# Patient Record
Sex: Female | Born: 2000 | Race: White | Hispanic: Yes | Marital: Single | Smoking: Never smoker
Health system: Southern US, Community
[De-identification: ages and names within clinical notes are randomized; demographics above are authoritative.]

## PROBLEM LIST (undated history)

## (undated) DIAGNOSIS — R630 Anorexia: Secondary | ICD-10-CM

---
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
## 2022-01-04 DIAGNOSIS — Z419 Encounter for procedure for purposes other than remedying health state, unspecified: Secondary | ICD-10-CM | POA: Diagnosis not present

## 2022-01-28 ENCOUNTER — Emergency Department (HOSPITAL_COMMUNITY)
Admission: EM | Admit: 2022-01-28 | Discharge: 2022-01-28 | Disposition: A | Discharge status: Home / Self Care | Payer: Medicaid Other | Attending: Emergency Medicine | Admitting: Emergency Medicine

## 2022-01-28 ENCOUNTER — Emergency Department (HOSPITAL_COMMUNITY): Payer: Medicaid Other

## 2022-01-28 ENCOUNTER — Other Ambulatory Visit: Payer: Self-pay

## 2022-01-28 ENCOUNTER — Encounter (HOSPITAL_COMMUNITY): Payer: Self-pay

## 2022-01-28 DIAGNOSIS — M25511 Pain in right shoulder: Secondary | ICD-10-CM | POA: Diagnosis not present

## 2022-01-28 DIAGNOSIS — G8929 Other chronic pain: Secondary | ICD-10-CM | POA: Diagnosis not present

## 2022-01-28 MED ORDER — KETOROLAC TROMETHAMINE 15 MG/ML IJ SOLN
15.0000 mg | Freq: Once | INTRAMUSCULAR | Status: AC
Start: 2022-01-28 — End: 2022-01-28
  Administered 2022-01-28: 15 mg via INTRAMUSCULAR
  Filled 2022-01-28: qty 1

## 2022-01-28 MED ORDER — NAPROXEN 375 MG PO TABS: 375.0000 mg | ORAL_TABLET | Freq: Two times a day (BID) | ORAL | 0 refills | Status: DC

## 2022-01-28 NOTE — ED Provider Notes (Signed)
[facility] EMERGENCY DEPARTMENT Provider Note   CSN: [id number] Arrival date & time: 01/28/22  1545     History Chief Complaint  Patient presents with   Shoulder Pain    Kristina Morrow is a 21 y.o. female who presents to the emergency department with right shoulder pain that has been chronic for some time.  Patient states that she was seen here late last year for similar symptoms.  X-ray was negative at that time.  Patient states now that her right shoulder is been bothering her more frequently and almost on a daily basis.  Nothing seems to make this better or worse.  She has not taken anything for this.  She never followed up with orthopedics the first time.   Shoulder Pain     Home Medications Prior to Admission medications   Medication Sig Start Date End Date Taking? Authorizing Provider  naproxen (NAPROSYN) 375 MG tablet Take 1 tablet (375 mg total) by mouth 2 (two) times daily. 01/28/22  Yes Meredeth Ide, Johan Creveling M, PA-C  clindamycin (CLEOCIN) 150 MG capsule Take 1 capsule (150 mg total) by mouth every 6 (six) hours. 06/02/18   Mabe, Latanya Maudlin, MD      Allergies    Peanut-containing drug products    Review of Systems   Review of Systems  All other systems reviewed and are negative.  Physical Exam Updated Vital Signs BP 123/84   Pulse 98   Temp 97.7 F (36.5 C) (Oral)   Resp 16   Ht 5\' 2"  (1.575 m)   Wt 53.5 kg   SpO2 100%   BMI 21.58 kg/m  Physical Exam Vitals and nursing note reviewed.  Constitutional:      Appearance: Normal appearance.  HENT:     Head: Normocephalic and atraumatic.  Eyes:     General:        Right eye: No discharge.        Left eye: No discharge.     Conjunctiva/sclera: Conjunctivae normal.  Pulmonary:     Effort: Pulmonary effort is normal.  Musculoskeletal:     Comments: She has full range of motion in the shoulder with passive and active range of motion.  Negative empty can, Hawkins, and Neer test.  No tenderness over the  bicipital groove.  Skin:    General: Skin is warm and dry.     Findings: No rash.  Neurological:     General: No focal deficit present.     Mental Status: She is alert.  Psychiatric:        Mood and Affect: Mood normal.        Behavior: Behavior normal.    ED Results / Procedures / Treatments   Labs (all labs ordered are listed, but only abnormal results are displayed) Labs Reviewed - No data to display  EKG None  Radiology DG Shoulder Right  Result Date: 01/28/2022 CLINICAL DATA:  Right shoulder pain EXAM: RIGHT SHOULDER - 2+ VIEW COMPARISON:  None Available. FINDINGS: There is no evidence of fracture or dislocation. There is no evidence of arthropathy or other focal bone abnormality. Soft tissues are unremarkable. IMPRESSION: Negative. Electronically Signed   By: 01/30/2022 M.D.   On: 01/28/2022 17:57    Procedures Procedures    Medications Ordered in ED Medications  ketorolac (TORADOL) 15 MG/ML injection 15 mg (15 mg Intramuscular Given 01/28/22 1930)    ED Course/ Medical Decision Making/ A&P  Medical Decision Making Kristina Morrow is a 21 y.o. female who presents to the emergency department today with right-sided shoulder pain.  This is been going on for a while and I have a low suspicion for any emergent pathology at this time.  Could be impingement syndrome, rotator cuff injury although the patient has negative impingement signs.  I personally ordered and interpreted x-ray of the right shoulder which was negative for any fractures or dislocations.  I will have her follow-up with orthopedics for further evaluation.   Amount and/or Complexity of Data Reviewed Radiology: ordered.  Risk Prescription drug management.   Final Clinical Impression(s) / ED Diagnoses Final diagnoses:  Chronic right shoulder pain    Rx / DC Orders ED Discharge Orders          Ordered    naproxen (NAPROSYN) 375 MG tablet  2 times daily        01/28/22 1932               Jolyn Lent 01/28/22 Virl Son, MD 01/28/22 [id number]

## 2022-01-28 NOTE — ED Provider Triage Note (Signed)
Emergency Medicine Provider Triage Evaluation Note  Kristina Morrow , a 21 y.o. female  was evaluated in triage.  Pt complains of right shoulder pain that is chronic and has been ongoing for some time. It has been worse over the last week. She denies injury.  Review of Systems  Positive:  Negative: See above   Physical Exam  BP 123/84   Pulse 98   Temp 97.7 F (36.5 C) (Oral)   Resp 16   SpO2 100%  Gen:   Awake, no distress   Resp:  Normal effort  MSK:   Moves extremities without difficulty.  Full range of motion in the right shoulder.  Neurovascular intact distal to the shoulder. Other:    Medical Decision Making  Medically screening exam initiated at 3:59 PM.  Appropriate orders placed.  Lanell Matar was informed that the remainder of the evaluation will be completed by another provider, this initial triage assessment does not replace that evaluation, and the importance of remaining in the ED until their evaluation is complete.     Honor [place], [place] 01/28/22 [phone]

## 2022-01-28 NOTE — ED Triage Notes (Signed)
Pt arrived POV from home c/o right shoulder pain. Pt denies any injury but states she does play softball.

## 2022-01-28 NOTE — Discharge Instructions (Addendum)
Please take naproxen as prescribed.  I would like for you to follow-up with orthopedics or your PCP for further evaluation.  Please return to the emergency department for any worsening symptoms you might have.

## 2022-01-28 NOTE — ED Notes (Signed)
Patient verbalizes understanding of discharge instructions. Opportunity for questioning and answers were provided. Armband removed by staff, pt discharged from ED to home via POV. Waited in ER 15 min after receiving IM injection, denied any adverse effects prior to departure.

## 2022-02-04 DIAGNOSIS — Z419 Encounter for procedure for purposes other than remedying health state, unspecified: Secondary | ICD-10-CM | POA: Diagnosis not present

## 2022-02-04 DIAGNOSIS — M25511 Pain in right shoulder: Secondary | ICD-10-CM | POA: Diagnosis not present

## 2022-03-04 ENCOUNTER — Encounter (HOSPITAL_COMMUNITY): Payer: Self-pay

## 2022-03-04 ENCOUNTER — Emergency Department (HOSPITAL_COMMUNITY)
Admission: EM | Admit: 2022-03-04 | Discharge: 2022-03-04 | Disposition: A | Discharge status: Home / Self Care | Payer: Medicaid Other | Attending: Emergency Medicine | Admitting: Emergency Medicine

## 2022-03-04 ENCOUNTER — Other Ambulatory Visit: Payer: Self-pay

## 2022-03-04 DIAGNOSIS — R110 Nausea: Secondary | ICD-10-CM | POA: Insufficient documentation

## 2022-03-04 DIAGNOSIS — I10 Essential (primary) hypertension: Secondary | ICD-10-CM | POA: Diagnosis not present

## 2022-03-04 DIAGNOSIS — F509 Eating disorder, unspecified: Secondary | ICD-10-CM | POA: Insufficient documentation

## 2022-03-04 DIAGNOSIS — R1084 Generalized abdominal pain: Secondary | ICD-10-CM | POA: Diagnosis not present

## 2022-03-04 DIAGNOSIS — R42 Dizziness and giddiness: Secondary | ICD-10-CM | POA: Insufficient documentation

## 2022-03-04 DIAGNOSIS — R109 Unspecified abdominal pain: Secondary | ICD-10-CM | POA: Insufficient documentation

## 2022-03-04 DIAGNOSIS — E860 Dehydration: Secondary | ICD-10-CM | POA: Diagnosis not present

## 2022-03-04 DIAGNOSIS — Z91010 Allergy to peanuts: Secondary | ICD-10-CM | POA: Insufficient documentation

## 2022-03-04 DIAGNOSIS — Z743 Need for continuous supervision: Secondary | ICD-10-CM | POA: Diagnosis not present

## 2022-03-04 HISTORY — DX: Anorexia: R63.0

## 2022-03-04 LAB — COMPREHENSIVE METABOLIC PANEL
ALT: 22 U/L (ref 0–44)
AST: 40 U/L (ref 15–41)
Albumin: 4.7 g/dL (ref 3.5–5.0)
Alkaline Phosphatase: 49 U/L (ref 38–126)
Anion gap: 12 (ref 5–15)
BUN: 10 mg/dL (ref 6–20)
CO2: 23 mmol/L (ref 22–32)
Calcium: 9.2 mg/dL (ref 8.9–10.3)
Chloride: 101 mmol/L (ref 98–111)
Creatinine, Ser: 0.75 mg/dL (ref 0.44–1.00)
GFR, Estimated: >60 mL/min (ref >60)
Glucose, Bld: 91 mg/dL (ref 70–99)
Potassium: 3.7 mmol/L (ref 3.5–5.1)
Sodium: 136 mmol/L (ref 135–145)
Total Bilirubin: 2 mg/dL — ABNORMAL HIGH (ref 0.3–1.2)
Total Protein: 8.2 g/dL — ABNORMAL HIGH (ref 6.5–8.1)

## 2022-03-04 LAB — CBC
HCT: 44.5 % (ref 36.0–46.0)
Hemoglobin: 15 g/dL (ref 12.0–15.0)
MCH: 29.8 pg (ref 26.0–34.0)
MCHC: 33.7 g/dL (ref 30.0–36.0)
MCV: 88.5 fL (ref 80.0–100.0)
Platelets: 276 10*3/uL (ref 150–400)
RBC: 5.03 MIL/uL (ref 3.87–5.11)
RDW: 12.9 % (ref 11.5–15.5)
WBC: 11.2 10*3/uL — ABNORMAL HIGH (ref 4.0–10.5)
nRBC: 0 % (ref 0.0–0.2)

## 2022-03-04 LAB — URINALYSIS, ROUTINE W REFLEX MICROSCOPIC
Bacteria, UA: NONE SEEN
Bilirubin Urine: NEGATIVE
Glucose, UA: NEGATIVE mg/dL
Ketones, ur: 80 mg/dL — AB
Leukocytes,Ua: NEGATIVE
Nitrite: NEGATIVE
Protein, ur: NEGATIVE mg/dL
Specific Gravity, Urine: 1.014 (ref 1.005–1.030)
pH: 6 (ref 5.0–8.0)

## 2022-03-04 LAB — I-STAT BETA HCG BLOOD, ED (MC, WL, AP ONLY): I-stat hCG, quantitative: <5 m[IU]/mL (ref <5)

## 2022-03-04 LAB — LIPASE, BLOOD: Lipase: 23 U/L (ref 11–51)

## 2022-03-04 MED ORDER — LACTATED RINGERS IV BOLUS
1000.0000 mL | Freq: Once | INTRAVENOUS | Status: AC
  Administered 2022-03-04: 1000 mL via INTRAVENOUS

## 2022-03-04 MED ORDER — LACTATED RINGERS IV SOLN: INTRAVENOUS | Status: DC

## 2022-03-04 NOTE — ED Provider Notes (Addendum)
[facility]-EMERGENCY DEPT Provider Note   CSN: [id number] Arrival date & time: 03/04/22  1050     History  Chief Complaint  Patient presents with   Abdominal Pain   Eating Disorder   Nausea   Dizziness    Kristina Morrow is a 21 y.o. female.  21 year old female with history of anorexia presents due to decreased oral intake.  Did have emesis a few days ago but has since resolved.  Had some abdominal cramping next 2 days ago which is also resolved.  Denies any fever.  Is concerned this may be dehydrated.  Denies any suicidal or homicidal ideations.  States that she feels as if she needs help with her anorexia at this time.  Has never been in treatment before in the past.  Denies any syncope or near syncope.  But does note some dizziness       Home Medications Prior to Admission medications   Medication Sig Start Date End Date Taking? Authorizing Provider  clindamycin (CLEOCIN) 150 MG capsule Take 1 capsule (150 mg total) by mouth every 6 (six) hours. 06/02/18   Mabe, Latanya Maudlin, MD  naproxen (NAPROSYN) 375 MG tablet Take 1 tablet (375 mg total) by mouth 2 (two) times daily. 01/28/22   Teressa Lower, PA-C      Allergies    Peanut-containing drug products    Review of Systems   Review of Systems  All other systems reviewed and are negative.   Physical Exam Updated Vital Signs BP 113/82 (BP Location: Left Arm)   Pulse 80   Temp 98 F (36.7 C) (Oral)   Resp 18   Ht 1.575 m (5\' 2" )   Wt 54.4 kg   LMP 02/11/2022   SpO2 100%   BMI 21.95 kg/m  Physical Exam Vitals and nursing note reviewed.  Constitutional:      General: She is not in acute distress.    Appearance: Normal appearance. She is well-developed. She is not toxic-appearing.  HENT:     Head: Normocephalic and atraumatic.  Eyes:     General: Lids are normal.     Conjunctiva/sclera: Conjunctivae normal.     Pupils: Pupils are equal, round, and reactive to light.  Neck:     Thyroid: No  thyroid mass.     Trachea: No tracheal deviation.  Cardiovascular:     Rate and Rhythm: Normal rate and regular rhythm.     Heart sounds: Normal heart sounds. No murmur heard.    No gallop.  Pulmonary:     Effort: Pulmonary effort is normal. No respiratory distress.     Breath sounds: Normal breath sounds. No stridor. No decreased breath sounds, wheezing, rhonchi or rales.  Abdominal:     General: There is no distension.     Palpations: Abdomen is soft.     Tenderness: There is no abdominal tenderness. There is no rebound.  Musculoskeletal:        General: No tenderness. Normal range of motion.     Cervical back: Normal range of motion and neck supple.  Skin:    General: Skin is warm and dry.     Findings: No abrasion or rash.  Neurological:     Mental Status: She is alert and oriented to person, place, and time. Mental status is at baseline.     GCS: GCS eye subscore is 4. GCS verbal subscore is 5. GCS motor subscore is 6.     Cranial Nerves: No cranial nerve deficit.  Sensory: No sensory deficit.     Motor: Motor function is intact.  Psychiatric:        Attention and Perception: Attention normal.        Speech: Speech normal.        Behavior: Behavior normal. Behavior is cooperative.     ED Results / Procedures / Treatments   Labs (all labs ordered are listed, but only abnormal results are displayed) Labs Reviewed  COMPREHENSIVE METABOLIC PANEL - Abnormal; Notable for the following components:      Result Value   Total Protein 8.2 (*)    Total Bilirubin 2.0 (*)    All other components within normal limits  CBC - Abnormal; Notable for the following components:   WBC 11.2 (*)    All other components within normal limits  LIPASE, BLOOD  URINALYSIS, ROUTINE W REFLEX MICROSCOPIC  I-STAT BETA HCG BLOOD, ED ([facility], [facility], [facility] ONLY)    EKG EKG Interpretation  Date/Time:  Thursday March 04 2022 12:26:47 EDT Ventricular Rate:  86 PR Interval:  94 QRS Duration: 64 QT  Interval:  356 QTC Calculation: 426 R Axis:   85 Text Interpretation: Sinus rhythm Short PR interval Abnormal Q suggests anterior infarct Confirmed by Lorre Nick ([id number]) on 03/04/2022 1:02:23 PM  Radiology No results found.  Procedures Procedures    Medications Ordered in ED Medications  lactated ringers bolus 1,000 mL (has no administration in time range)  lactated ringers infusion (has no administration in time range)    ED Course/ Medical Decision Making/ A&P                           Medical Decision Making Amount and/or Complexity of Data Reviewed Labs: ordered.  Risk Prescription drug management.   Patient given IV fluids here.  Electrolytes are stable.  Patient did have greater than 80 ketones in her urine indicating dehydration.  She has no abdominal pain on exam.  Do not feel that she requires imaging of her abdomen.  Patient has no acute psychiatric emergency.  Plan will be for patient to be discharged and follow-up with her primary care doctor        Final Clinical Impression(s) / ED Diagnoses Final diagnoses:  None    Rx / DC Orders ED Discharge Orders     None         Lorre Nick, MD 03/04/22 1422    Lorre Nick, MD 03/04/22 1436

## 2022-03-04 NOTE — ED Notes (Signed)
Pt prompted to provide urine sample, but unable to at this time.  

## 2022-03-04 NOTE — ED Provider Triage Note (Signed)
Emergency Medicine Provider Triage Evaluation Note  Kristina Morrow , a 21 y.o. female  was evaluated in triage.  Pt complains of lightheadedness, decreased p.o. intake, left-sided abdominal pain.  Denies dysuria.  Review of Systems  Positive: As above Negative: As above  Physical Exam  BP 113/82 (BP Location: Left Arm)   Pulse 80   Temp 98 F (36.7 C) (Oral)   Resp 18   Ht 5\' 2"  (1.575 m)   Wt 54.4 kg   LMP 02/11/2022   SpO2 100%   BMI 21.95 kg/m  Gen:   Awake, no distress   Resp:  Normal effort  MSK:   Moves extremities without difficulty  Other:  Abdomen nontender and nondistended  Medical Decision Making  Medically screening exam initiated at 12:07 PM.  Appropriate orders placed.  04/13/2022 was informed that the remainder of the evaluation will be completed by another provider, this initial triage assessment does not replace that evaluation, and the importance of remaining in the ED until their evaluation is complete.     Lanell Matar, PA-C 03/04/22 1208

## 2022-03-04 NOTE — ED Provider Triage Note (Signed)
Emergency Medicine Provider Triage Evaluation Note  Kristina Morrow , a 21 y.o. female  was evaluated in triage.  Pt complains of abdominal pain and decreased p.o. intake.  Patient reports that she is anorexic and the only thing she has had to eat or drink in the last 48 hours is 1 banana.  Patient reports that she started having some left upper abdominal pain, nausea, and feeling lightheaded.  Abdominal pain and nausea have resolved.  Denies any illicit drug use.  Endorses occasional alcohol use.  Review of Systems  Positive: Abdominal pain, nausea, lightheadedness, decreased p.o. intake Negative: Fever, chills, vomiting, blood in stool, melena, dysuria, hematuria, urinary urgency, vaginal pain, vaginal bleeding, vaginal discharge, SI, HI, AVH  Physical Exam  BP 113/82 (BP Location: Left Arm)   Pulse 80   Temp 98 F (36.7 C) (Oral)   Resp 18   Ht 5\' 2"  (1.575 m)   Wt 54.4 kg   LMP 02/11/2022   SpO2 100%   BMI 21.95 kg/m  Gen:   Awake, no distress   Resp:  Normal effort  MSK:   Moves extremities without difficulty  Other:  Abdomen soft, nondistended, nontender no guarding or rebound tenderness.  Medical Decision Making  Medically screening exam initiated at 11:49 AM.  Appropriate orders placed.  04/13/2022 was informed that the remainder of the evaluation will be completed by another provider, this initial triage assessment does not replace that evaluation, and the importance of remaining in the ED until their evaluation is complete.  Labs ordered to evaluate for abdominal pain   Lanell Matar, PA-C 03/04/22 1150

## 2022-03-04 NOTE — ED Triage Notes (Signed)
Per EMS- Patient is from home. Patient reported that she has anorexia. Patient states she has not had anything to eat or drink in several days. Patient c/o dizziness and left abdominal pain.  EMS gave LR-150 ml.

## 2022-03-06 DIAGNOSIS — Z419 Encounter for procedure for purposes other than remedying health state, unspecified: Secondary | ICD-10-CM | POA: Diagnosis not present

## 2022-03-11 DIAGNOSIS — F419 Anxiety disorder, unspecified: Secondary | ICD-10-CM | POA: Diagnosis not present

## 2022-03-11 DIAGNOSIS — F5000 Anorexia nervosa, unspecified: Secondary | ICD-10-CM | POA: Diagnosis not present

## 2022-03-19 DIAGNOSIS — F411 Generalized anxiety disorder: Secondary | ICD-10-CM | POA: Diagnosis not present

## 2022-03-22 DIAGNOSIS — F411 Generalized anxiety disorder: Secondary | ICD-10-CM | POA: Diagnosis not present

## 2022-03-29 DIAGNOSIS — F411 Generalized anxiety disorder: Secondary | ICD-10-CM | POA: Diagnosis not present

## 2022-04-03 ENCOUNTER — Encounter (HOSPITAL_COMMUNITY): Payer: Self-pay

## 2022-04-03 ENCOUNTER — Ambulatory Visit (HOSPITAL_COMMUNITY)
Admission: EM | Admit: 2022-04-03 | Discharge: 2022-04-03 | Disposition: A | Discharge status: Home / Self Care | Payer: Medicaid Other | Attending: Physician Assistant | Admitting: Physician Assistant

## 2022-04-03 DIAGNOSIS — M654 Radial styloid tenosynovitis [de Quervain]: Secondary | ICD-10-CM | POA: Diagnosis not present

## 2022-04-03 MED ORDER — IBUPROFEN 600 MG PO TABS: 600.0000 mg | ORAL_TABLET | Freq: Three times a day (TID) | ORAL | 0 refills | Status: DC | PRN

## 2022-04-03 NOTE — ED Provider Notes (Signed)
[facility]    CSN: [id number] Arrival date & time: 04/03/22  1535      History   Chief Complaint Chief Complaint  Patient presents with   Wrist Pain    HPI Kristina Morrow is a 21 y.o. female.   Patient is today with a 10-day history of left thumb/wrist pain.  Denies any known injury or increase in activity prior to symptom onset.  Pain is rated 8 on a 0-10 pain scale, localized to left first MCP joint, described as sharp, worse with palpation or certain movements.  She does report some numbness in the distal part of her fingers that occurs intermittently.  She is not currently experiencing those symptoms.  She is right-handed.  She has tried some ibuprofen with temporary relief of symptoms.  Denies episodes of similar symptoms in the past.    Past Medical History:  Diagnosis Date   Anorexia     There are no problems to display for this patient.   History reviewed. No pertinent surgical history.  OB History   No obstetric history on file.      Home Medications    Prior to Admission medications   Medication Sig Start Date End Date Taking? Authorizing Provider  ibuprofen (ADVIL) 600 MG tablet Take 1 tablet (600 mg total) by mouth every 8 (eight) hours as needed. 04/03/22  Yes Keldric Poyer, Noberto Retort, PA-C    Family History Family History  Problem Relation Age of Onset   Healthy Mother    Healthy Father     Social History Social History   Tobacco Use   Smoking status: Never   Smokeless tobacco: Never  Vaping Use   Vaping Use: Some days   Substances: Nicotine, Flavoring  Substance Use Topics   Alcohol use: Never   Drug use: Never     Allergies   Peanut-containing drug products   Review of Systems Review of Systems  Constitutional:  Positive for activity change. Negative for appetite change, fatigue and fever.  Musculoskeletal:  Positive for arthralgias and myalgias.  Skin:  Negative for color change and wound.  Neurological:  Negative for  weakness and numbness.     Physical Exam Triage Vital Signs ED Triage Vitals [04/03/22 1627]  Enc Vitals Group     BP 122/86     Pulse Rate 84     Resp 16     Temp 98.8 F (37.1 C)     Temp Source Oral     SpO2 100 %     Weight      Height      Head Circumference      Peak Flow      Pain Score 8     Pain Loc      Pain Edu?      Excl. in GC?    No data found.  Updated Vital Signs BP 122/86 (BP Location: Right Arm)   Pulse 84   Temp 98.8 F (37.1 C) (Oral)   Resp 16   LMP 03/20/2022 (Approximate)   SpO2 100%   Visual Acuity Right Eye Distance:   Left Eye Distance:   Bilateral Distance:    Right Eye Near:   Left Eye Near:    Bilateral Near:     Physical Exam Vitals reviewed.  Constitutional:      General: She is awake. She is not in acute distress.    Appearance: Normal appearance. She is well-developed. She is not ill-appearing.     Comments:  Very pleasant female appears stated age in no acute distress sitting comfortably in exam room  HENT:     Head: Normocephalic and atraumatic.  Cardiovascular:     Rate and Rhythm: Normal rate and regular rhythm.     Heart sounds: Normal heart sounds, S1 normal and S2 normal. No murmur heard.    Comments: Capillary refill within 2 seconds left fingers Pulmonary:     Effort: Pulmonary effort is normal.     Breath sounds: Normal breath sounds. No wheezing, rhonchi or rales.     Comments: Clear to auscultation bilaterally Abdominal:     Palpations: Abdomen is soft.     Tenderness: There is no abdominal tenderness.  Musculoskeletal:     Left wrist: No swelling, tenderness, bony tenderness or snuff box tenderness. Normal range of motion.     Left hand: Tenderness present. No swelling or bony tenderness. Normal range of motion. Normal strength. There is no disruption of two-point discrimination.     Comments: Left hand/wrist: No deformity.  Tenderness palpation over left first MCP joint.  Positive Finkelstein.  Hand  neurovascularly intact.  Psychiatric:        Behavior: Behavior is cooperative.      UC Treatments / Results  Labs (all labs ordered are listed, but only abnormal results are displayed) Labs Reviewed - No data to display  EKG   Radiology No results found.  Procedures Procedures (including critical care time)  Medications Ordered in UC Medications - No data to display  Initial Impression / Assessment and Plan / UC Course  I have reviewed the triage vital signs and the nursing notes.  Pertinent labs & imaging results that were available during my care of the patient were reviewed by me and considered in my medical decision making (see chart for details).     Discussed symptoms are consistent with de Quervain's tenosynovitis.  Patient was placed in a brace for comfort and support.  Recommended RICE protocol for additional symptom relief.  She was started on ibuprofen for symptom relief.  Discussed that she should not take additional NSAIDs with this medication due to risk of GI bleeding but can use Tylenol for breakthrough pain.  If her symptoms or not improving she is to follow-up with hand specialist particular if she has recurrent numbness/paresthesias.  She was given contact information for local provider with instruction to call to schedule appointment.  Discussed that if she has any worsening symptoms she needs to be seen immediately.  Strict return precautions given.  Work excuse note provided.  Final Clinical Impressions(s) / UC Diagnoses   Final diagnoses:  De Quervain's tenosynovitis, left     Discharge Instructions      Use brace for comfort and support.  You can use ice on area.  Take ibuprofen regularly.  Do not take additional NSAIDs with this medication including aspirin, ibuprofen/Advil, naproxen/Aleve.  Follow-up with hand specialist if your symptoms persist and you have recurrent numbness in your fingers.  If anything worsens please return  immediately.     ED Prescriptions     Medication Sig Dispense Auth. Provider   ibuprofen (ADVIL) 600 MG tablet Take 1 tablet (600 mg total) by mouth every 8 (eight) hours as needed. 30 tablet Kaidynce Pfister, Noberto Retort, PA-C      PDMP not reviewed this encounter.   Jeani Hawking, PA-C 04/03/22 1653

## 2022-04-03 NOTE — Discharge Instructions (Addendum)
Use brace for comfort and support.  You can use ice on area.  Take ibuprofen regularly.  Do not take additional NSAIDs with this medication including aspirin, ibuprofen/Advil, naproxen/Aleve.  Follow-up with hand specialist if your symptoms persist and you have recurrent numbness in your fingers.  If anything worsens please return immediately.

## 2022-04-03 NOTE — ED Triage Notes (Signed)
Pt states left wrist pain for the past 10 days.  Denies injury. States she took motrin last night with some relief.

## 2022-04-06 DIAGNOSIS — Z419 Encounter for procedure for purposes other than remedying health state, unspecified: Secondary | ICD-10-CM | POA: Diagnosis not present

## 2022-04-19 DIAGNOSIS — F411 Generalized anxiety disorder: Secondary | ICD-10-CM | POA: Diagnosis not present

## 2022-04-30 DIAGNOSIS — F419 Anxiety disorder, unspecified: Secondary | ICD-10-CM | POA: Diagnosis not present

## 2022-04-30 DIAGNOSIS — F642 Gender identity disorder of childhood: Secondary | ICD-10-CM | POA: Diagnosis not present

## 2022-04-30 DIAGNOSIS — F5000 Anorexia nervosa, unspecified: Secondary | ICD-10-CM | POA: Diagnosis not present

## 2022-05-03 DIAGNOSIS — F411 Generalized anxiety disorder: Secondary | ICD-10-CM | POA: Diagnosis not present

## 2022-05-07 DIAGNOSIS — Z419 Encounter for procedure for purposes other than remedying health state, unspecified: Secondary | ICD-10-CM | POA: Diagnosis not present

## 2022-05-10 DIAGNOSIS — F411 Generalized anxiety disorder: Secondary | ICD-10-CM | POA: Diagnosis not present

## 2022-05-17 DIAGNOSIS — F411 Generalized anxiety disorder: Secondary | ICD-10-CM | POA: Diagnosis not present

## 2022-05-26 DIAGNOSIS — F411 Generalized anxiety disorder: Secondary | ICD-10-CM | POA: Diagnosis not present

## 2022-05-31 DIAGNOSIS — F411 Generalized anxiety disorder: Secondary | ICD-10-CM | POA: Diagnosis not present

## 2022-06-06 DIAGNOSIS — Z419 Encounter for procedure for purposes other than remedying health state, unspecified: Secondary | ICD-10-CM | POA: Diagnosis not present

## 2022-06-07 DIAGNOSIS — F411 Generalized anxiety disorder: Secondary | ICD-10-CM | POA: Diagnosis not present

## 2022-06-09 DIAGNOSIS — F411 Generalized anxiety disorder: Secondary | ICD-10-CM | POA: Diagnosis not present

## 2022-06-09 DIAGNOSIS — Z724 Inappropriate diet and eating habits: Secondary | ICD-10-CM | POA: Diagnosis not present

## 2022-06-09 DIAGNOSIS — Z79899 Other long term (current) drug therapy: Secondary | ICD-10-CM | POA: Diagnosis not present

## 2022-06-14 DIAGNOSIS — F411 Generalized anxiety disorder: Secondary | ICD-10-CM | POA: Diagnosis not present

## 2022-06-23 DIAGNOSIS — F411 Generalized anxiety disorder: Secondary | ICD-10-CM | POA: Diagnosis not present

## 2022-06-30 DIAGNOSIS — F411 Generalized anxiety disorder: Secondary | ICD-10-CM | POA: Diagnosis not present

## 2022-07-07 DIAGNOSIS — Z419 Encounter for procedure for purposes other than remedying health state, unspecified: Secondary | ICD-10-CM | POA: Diagnosis not present

## 2022-07-13 DIAGNOSIS — Z724 Inappropriate diet and eating habits: Secondary | ICD-10-CM | POA: Diagnosis not present

## 2022-07-13 DIAGNOSIS — F411 Generalized anxiety disorder: Secondary | ICD-10-CM | POA: Diagnosis not present

## 2022-07-15 DIAGNOSIS — F411 Generalized anxiety disorder: Secondary | ICD-10-CM | POA: Diagnosis not present

## 2022-08-06 DIAGNOSIS — Z419 Encounter for procedure for purposes other than remedying health state, unspecified: Secondary | ICD-10-CM | POA: Diagnosis not present

## 2022-08-07 DIAGNOSIS — F411 Generalized anxiety disorder: Secondary | ICD-10-CM | POA: Diagnosis not present

## 2022-08-11 DIAGNOSIS — F649 Gender identity disorder, unspecified: Secondary | ICD-10-CM | POA: Diagnosis not present

## 2022-08-12 DIAGNOSIS — F411 Generalized anxiety disorder: Secondary | ICD-10-CM | POA: Diagnosis not present

## 2022-08-19 DIAGNOSIS — Z00129 Encounter for routine child health examination without abnormal findings: Secondary | ICD-10-CM | POA: Diagnosis not present

## 2022-08-19 DIAGNOSIS — F419 Anxiety disorder, unspecified: Secondary | ICD-10-CM | POA: Diagnosis not present

## 2022-08-19 DIAGNOSIS — Z7182 Exercise counseling: Secondary | ICD-10-CM | POA: Diagnosis not present

## 2022-08-19 DIAGNOSIS — Z68.41 Body mass index (BMI) pediatric, 5th percentile to less than 85th percentile for age: Secondary | ICD-10-CM | POA: Diagnosis not present

## 2022-08-19 DIAGNOSIS — Z713 Dietary counseling and surveillance: Secondary | ICD-10-CM | POA: Diagnosis not present

## 2022-08-24 DIAGNOSIS — Z724 Inappropriate diet and eating habits: Secondary | ICD-10-CM | POA: Diagnosis not present

## 2022-08-24 DIAGNOSIS — F411 Generalized anxiety disorder: Secondary | ICD-10-CM | POA: Diagnosis not present

## 2022-09-02 DIAGNOSIS — F411 Generalized anxiety disorder: Secondary | ICD-10-CM | POA: Diagnosis not present

## 2022-09-06 DIAGNOSIS — Z419 Encounter for procedure for purposes other than remedying health state, unspecified: Secondary | ICD-10-CM | POA: Diagnosis not present

## 2022-09-09 DIAGNOSIS — F411 Generalized anxiety disorder: Secondary | ICD-10-CM | POA: Diagnosis not present

## 2022-09-23 DIAGNOSIS — F411 Generalized anxiety disorder: Secondary | ICD-10-CM | POA: Diagnosis not present

## 2022-10-07 DIAGNOSIS — F411 Generalized anxiety disorder: Secondary | ICD-10-CM | POA: Diagnosis not present

## 2022-10-07 DIAGNOSIS — Z419 Encounter for procedure for purposes other than remedying health state, unspecified: Secondary | ICD-10-CM | POA: Diagnosis not present

## 2022-10-14 DIAGNOSIS — F411 Generalized anxiety disorder: Secondary | ICD-10-CM | POA: Diagnosis not present

## 2022-11-05 DIAGNOSIS — Z419 Encounter for procedure for purposes other than remedying health state, unspecified: Secondary | ICD-10-CM | POA: Diagnosis not present

## 2022-11-10 DIAGNOSIS — F649 Gender identity disorder, unspecified: Secondary | ICD-10-CM | POA: Diagnosis not present

## 2022-11-10 DIAGNOSIS — F411 Generalized anxiety disorder: Secondary | ICD-10-CM | POA: Diagnosis not present

## 2022-11-15 IMAGING — CR DG SHOULDER 2+V*R*
3 series · 3 of 3 positions shown · non-contrast
Comparison: None Available.

CLINICAL DATA: Right shoulder pain

EXAM:
RIGHT SHOULDER - 2+ VIEW

[shoulder grashey]
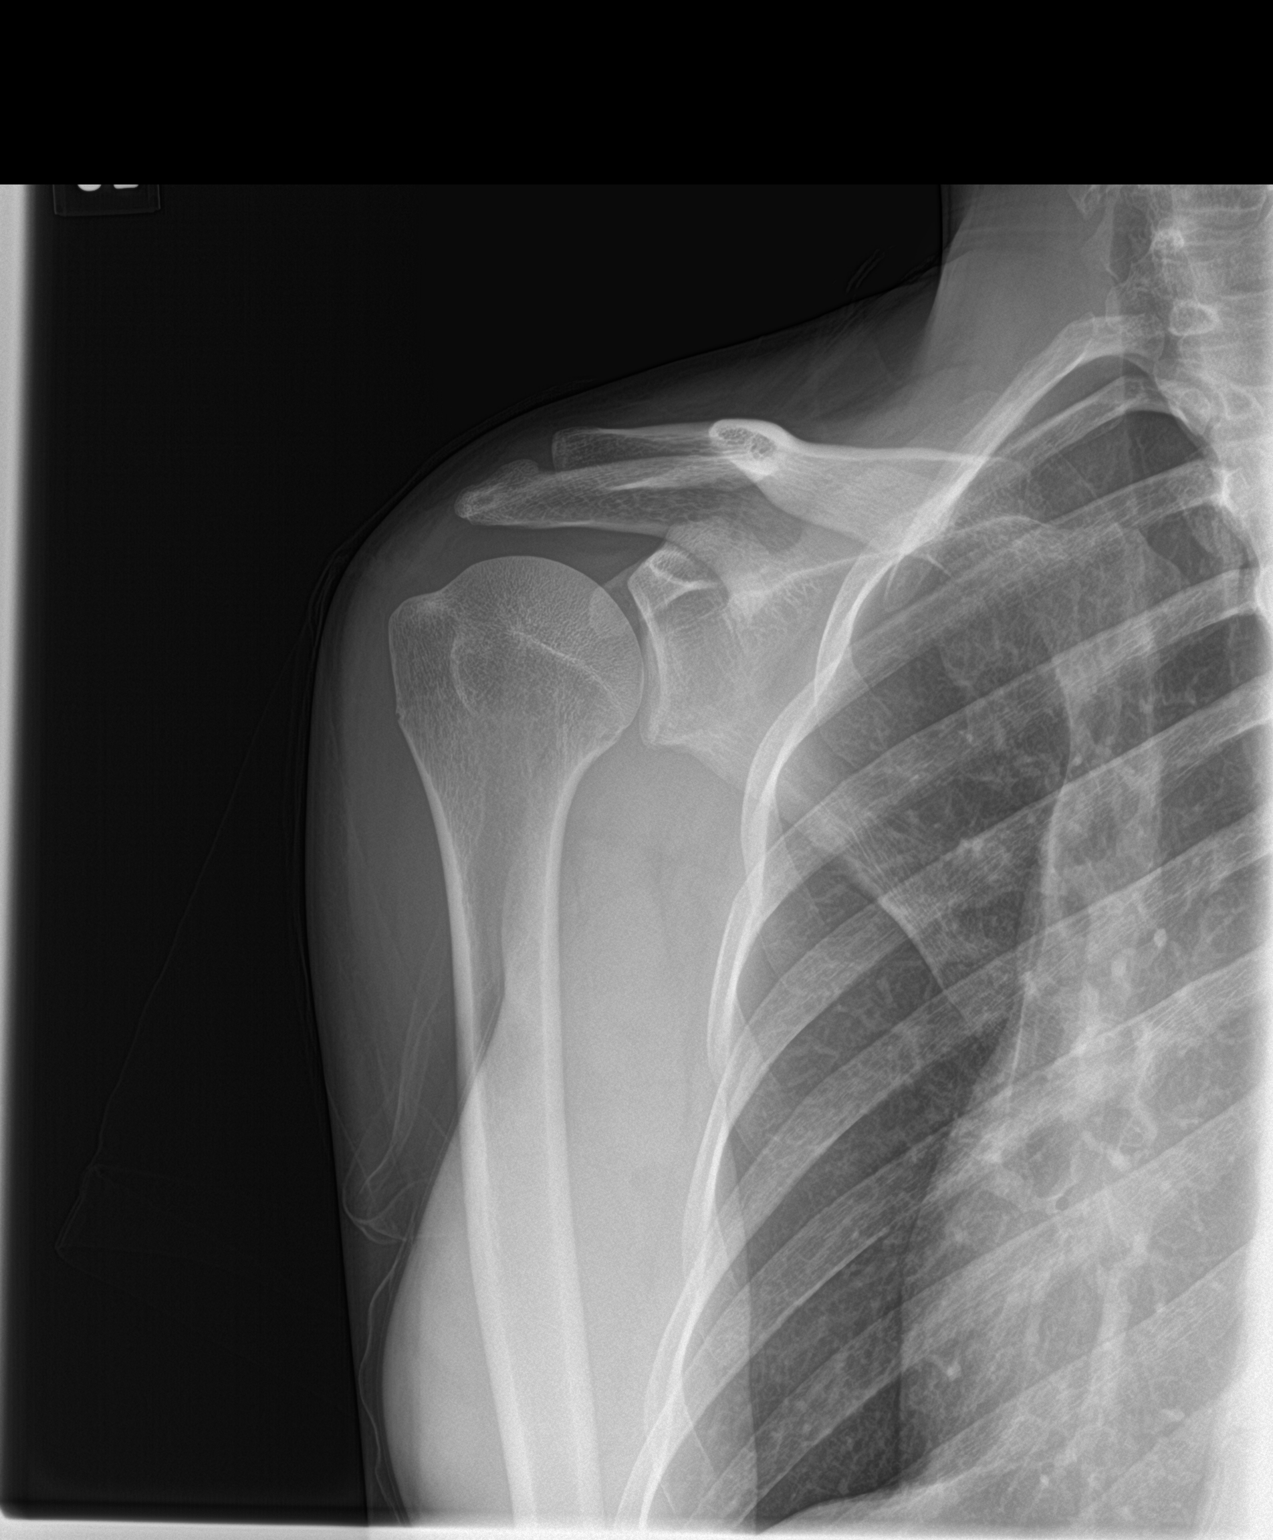

[shoulder y view]
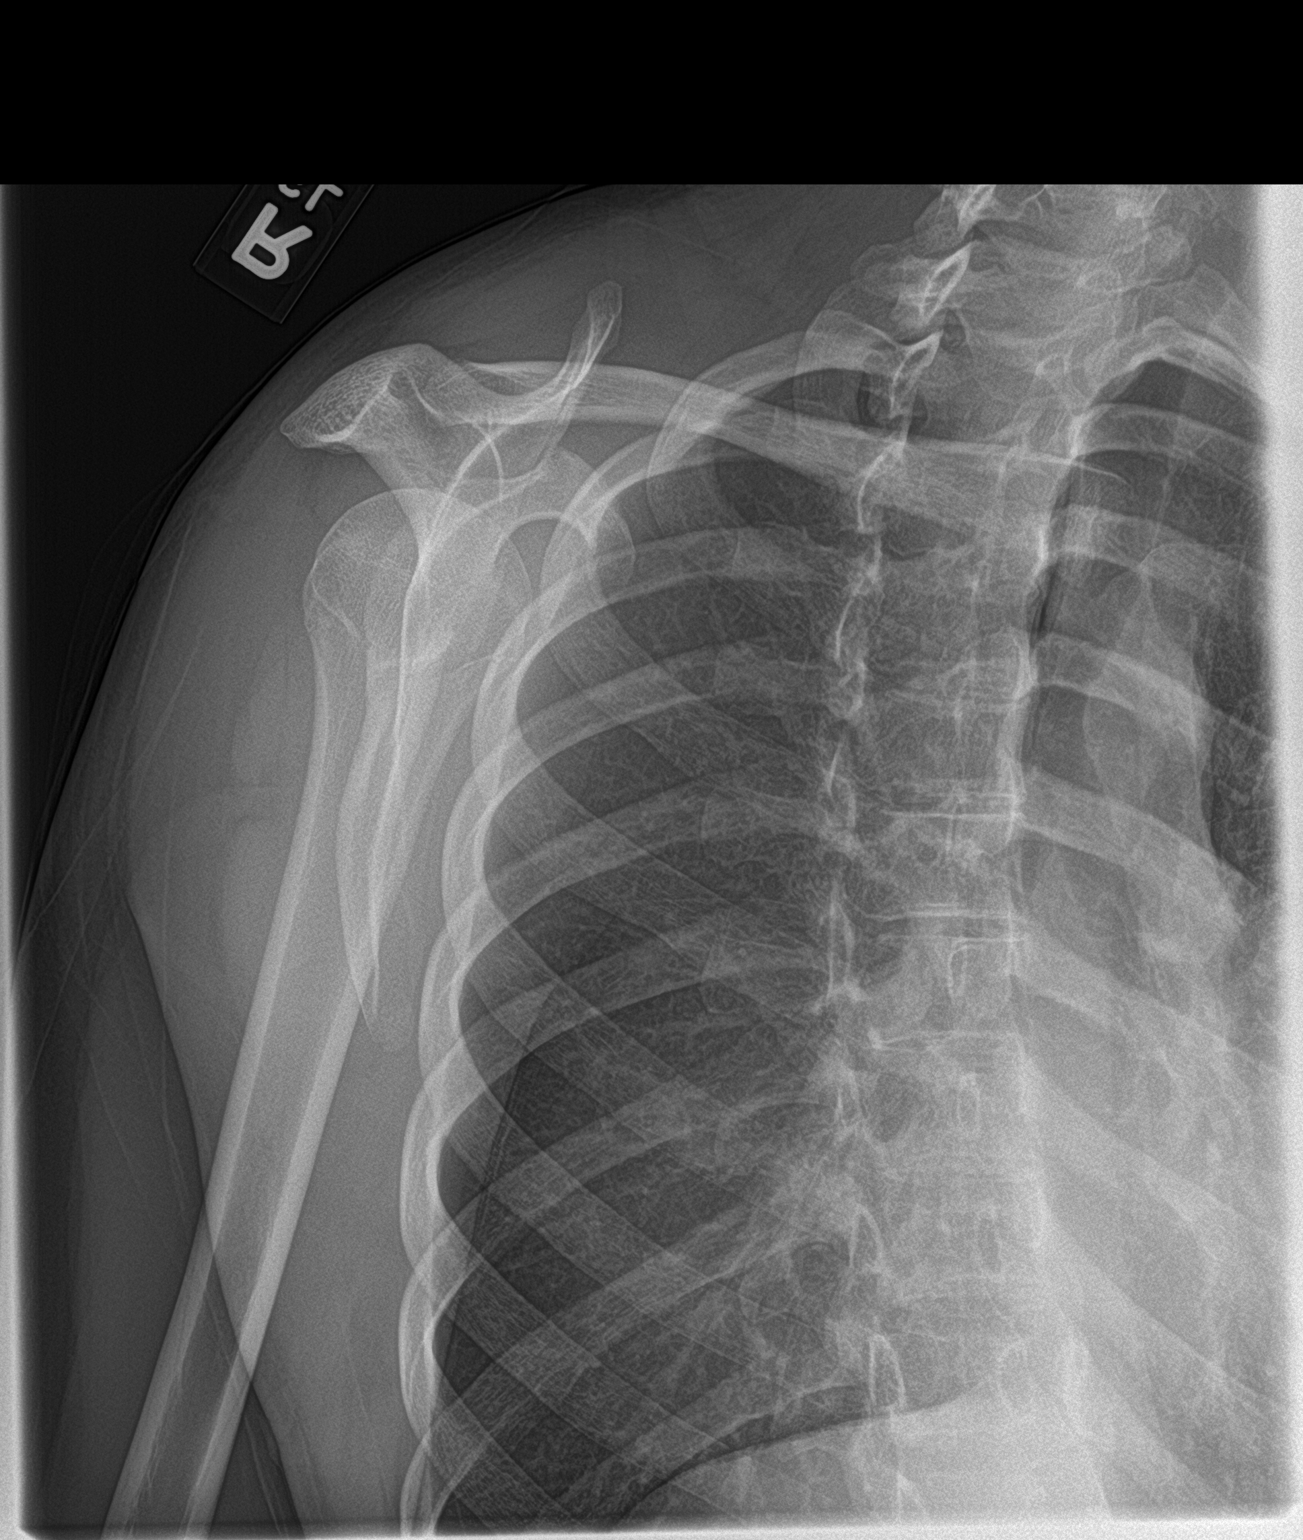

[shoulder ap neutral]
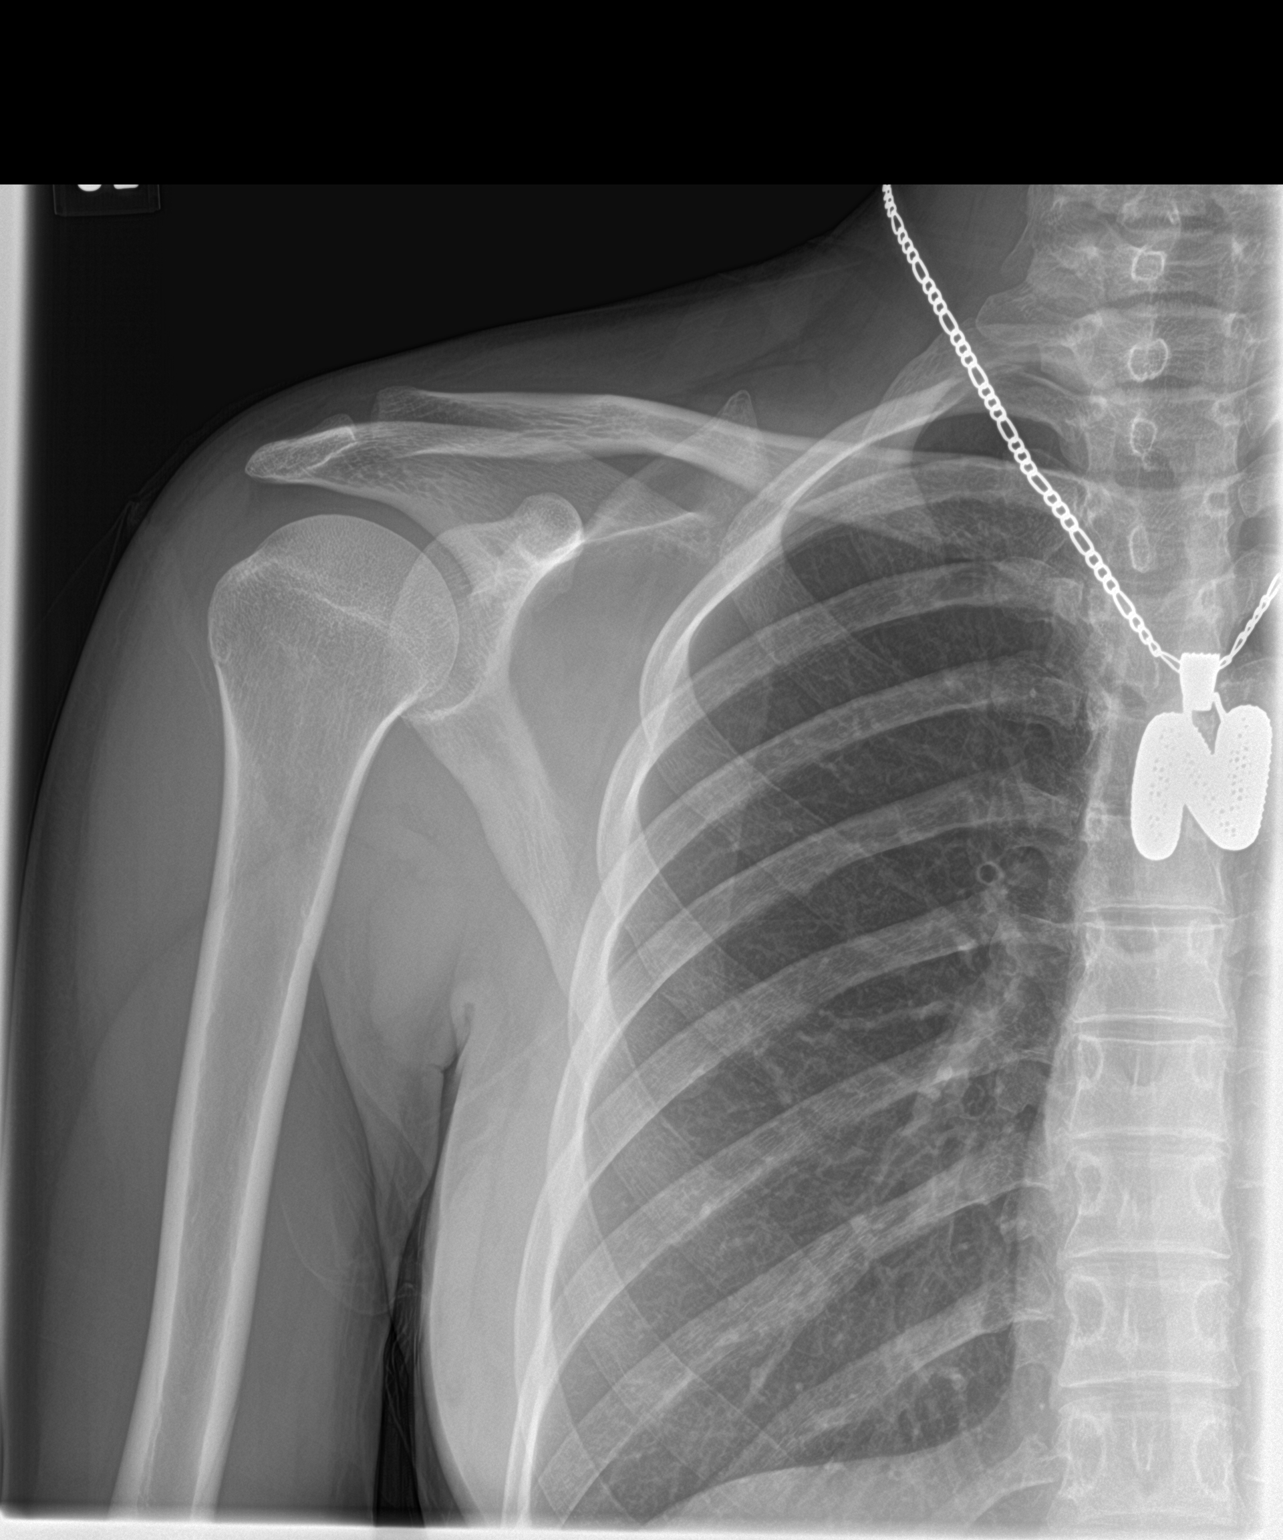

[3 of 3 positions shown; findings below may reference images not displayed]

FINDINGS: There is no evidence of fracture or dislocation. There is no
evidence of arthropathy or other focal bone abnormality. Soft
tissues are unremarkable.
IMPRESSION: Negative.

## 2022-12-06 DIAGNOSIS — Z419 Encounter for procedure for purposes other than remedying health state, unspecified: Secondary | ICD-10-CM | POA: Diagnosis not present

## 2022-12-15 DIAGNOSIS — F411 Generalized anxiety disorder: Secondary | ICD-10-CM | POA: Diagnosis not present

## 2023-01-05 DIAGNOSIS — Z419 Encounter for procedure for purposes other than remedying health state, unspecified: Secondary | ICD-10-CM | POA: Diagnosis not present

## 2023-02-05 DIAGNOSIS — Z419 Encounter for procedure for purposes other than remedying health state, unspecified: Secondary | ICD-10-CM | POA: Diagnosis not present

## 2023-02-08 DIAGNOSIS — F411 Generalized anxiety disorder: Secondary | ICD-10-CM | POA: Diagnosis not present

## 2023-03-07 DIAGNOSIS — Z419 Encounter for procedure for purposes other than remedying health state, unspecified: Secondary | ICD-10-CM | POA: Diagnosis not present

## 2023-03-16 DIAGNOSIS — F411 Generalized anxiety disorder: Secondary | ICD-10-CM | POA: Diagnosis not present

## 2023-03-22 DIAGNOSIS — F411 Generalized anxiety disorder: Secondary | ICD-10-CM | POA: Diagnosis not present

## 2023-04-04 DIAGNOSIS — F411 Generalized anxiety disorder: Secondary | ICD-10-CM | POA: Diagnosis not present

## 2023-04-07 DIAGNOSIS — Z419 Encounter for procedure for purposes other than remedying health state, unspecified: Secondary | ICD-10-CM | POA: Diagnosis not present

## 2023-04-20 DIAGNOSIS — F649 Gender identity disorder, unspecified: Secondary | ICD-10-CM | POA: Diagnosis not present

## 2023-04-26 DIAGNOSIS — F411 Generalized anxiety disorder: Secondary | ICD-10-CM | POA: Diagnosis not present

## 2023-05-08 DIAGNOSIS — Z419 Encounter for procedure for purposes other than remedying health state, unspecified: Secondary | ICD-10-CM | POA: Diagnosis not present

## 2023-06-07 DIAGNOSIS — Z419 Encounter for procedure for purposes other than remedying health state, unspecified: Secondary | ICD-10-CM | POA: Diagnosis not present

## 2023-06-08 DIAGNOSIS — F411 Generalized anxiety disorder: Secondary | ICD-10-CM | POA: Diagnosis not present

## 2023-07-08 DIAGNOSIS — Z419 Encounter for procedure for purposes other than remedying health state, unspecified: Secondary | ICD-10-CM | POA: Diagnosis not present

## 2023-07-22 DIAGNOSIS — F649 Gender identity disorder, unspecified: Secondary | ICD-10-CM | POA: Diagnosis not present

## 2023-07-26 DIAGNOSIS — F411 Generalized anxiety disorder: Secondary | ICD-10-CM | POA: Diagnosis not present

## 2023-08-07 DIAGNOSIS — Z419 Encounter for procedure for purposes other than remedying health state, unspecified: Secondary | ICD-10-CM | POA: Diagnosis not present

## 2023-08-24 DIAGNOSIS — F411 Generalized anxiety disorder: Secondary | ICD-10-CM | POA: Diagnosis not present

## 2023-09-07 DIAGNOSIS — Z419 Encounter for procedure for purposes other than remedying health state, unspecified: Secondary | ICD-10-CM | POA: Diagnosis not present

## 2023-10-08 DIAGNOSIS — Z419 Encounter for procedure for purposes other than remedying health state, unspecified: Secondary | ICD-10-CM | POA: Diagnosis not present

## 2023-11-05 DIAGNOSIS — Z419 Encounter for procedure for purposes other than remedying health state, unspecified: Secondary | ICD-10-CM | POA: Diagnosis not present

## 2023-11-16 DIAGNOSIS — Z419 Encounter for procedure for purposes other than remedying health state, unspecified: Secondary | ICD-10-CM | POA: Diagnosis not present

## 2023-12-07 DIAGNOSIS — F649 Gender identity disorder, unspecified: Secondary | ICD-10-CM | POA: Diagnosis not present

## 2023-12-15 DIAGNOSIS — F411 Generalized anxiety disorder: Secondary | ICD-10-CM | POA: Diagnosis not present

## 2023-12-17 DIAGNOSIS — Z419 Encounter for procedure for purposes other than remedying health state, unspecified: Secondary | ICD-10-CM | POA: Diagnosis not present

## 2024-01-16 ENCOUNTER — Ambulatory Visit (HOSPITAL_COMMUNITY)
Admission: EM | Admit: 2024-01-16 | Discharge: 2024-01-16 | Disposition: A | Discharge status: Home / Self Care | Attending: Family Medicine | Admitting: Family Medicine

## 2024-01-16 ENCOUNTER — Other Ambulatory Visit: Payer: Self-pay

## 2024-01-16 ENCOUNTER — Encounter (HOSPITAL_COMMUNITY): Payer: Self-pay | Admitting: Emergency Medicine

## 2024-01-16 DIAGNOSIS — R1013 Epigastric pain: Secondary | ICD-10-CM | POA: Diagnosis not present

## 2024-01-16 DIAGNOSIS — Z419 Encounter for procedure for purposes other than remedying health state, unspecified: Secondary | ICD-10-CM | POA: Diagnosis not present

## 2024-01-16 MED ORDER — ALUM & MAG HYDROXIDE-SIMETH 200-200-20 MG/5ML PO SUSP
ORAL | Status: AC
Start: 2024-01-16 — End: ?
  Filled 2024-01-16: qty 30

## 2024-01-16 MED ORDER — ALUM & MAG HYDROXIDE-SIMETH 200-200-20 MG/5ML PO SUSP
ORAL | Status: AC
  Filled 2024-01-16: qty 30

## 2024-01-16 MED ORDER — OMEPRAZOLE 20 MG PO CPDR: 20.0000 mg | DELAYED_RELEASE_CAPSULE | Freq: Every day | ORAL | 0 refills | Status: AC

## 2024-01-16 MED ORDER — ALUM & MAG HYDROXIDE-SIMETH 200-200-20 MG/5ML PO SUSP
30.0000 mL | Freq: Once | ORAL | Status: AC
  Administered 2024-01-16: 30 mL via ORAL

## 2024-01-16 NOTE — Discharge Instructions (Signed)
 You were seen today for abdominal pain.  I this this is likely related to increased acid/irritation.  I have given you an oral medication while here today.  I have sent out a medication to help for this.  You may take this daily x 2 weeks.  Avoid alcohol, NSAIDS, and spicy foods.  Please return if you are not improving.

## 2024-01-16 NOTE — ED Provider Notes (Signed)
 [facility]    CSN: [id number] Arrival date & time: 01/16/24  1343      History   Chief Complaint Chief Complaint  Patient presents with   Abdominal Pain    HPI Kristina Morrow is a 23 y.o. female.    Abdominal Pain  Patient is here for abdominal pain, at the epigastric area.  She woke up with this today.  She went to bed fine last night.  It is a bit better, but painful if you apply pressure.  Some nausea but no vomiting.  No new foods, ate/drink normally the days before.  No diarrhea.  Some constipation . She does have acid reflux at times,but does not take anything for this.  She was eating a bunch of very sour candies this weekend, and not sure if that has contributed.        Past Medical History:  Diagnosis Date   Anorexia     There are no active problems to display for this patient.   History reviewed. No pertinent surgical history.  OB History   No obstetric history on file.      Home Medications    Prior to Admission medications   Medication Sig Start Date End Date Taking? Authorizing Provider  ibuprofen  (ADVIL ) 600 MG tablet Take 1 tablet (600 mg total) by mouth every 8 (eight) hours as needed. 04/03/22   Raspet, Betsey Brow, PA-C    Family History Family History  Problem Relation Age of Onset   Healthy Mother    Healthy Father     Social History Social History   Tobacco Use   Smoking status: Never   Smokeless tobacco: Never  Vaping Use   Vaping status: Former   Substances: Nicotine, Flavoring  Substance Use Topics   Alcohol use: Never   Drug use: Never     Allergies   Peanut-containing drug products   Review of Systems Review of Systems  Constitutional: Negative.   HENT: Negative.    Respiratory: Negative.    Gastrointestinal:  Positive for abdominal pain.  Genitourinary: Negative.   Musculoskeletal: Negative.      Physical Exam Triage Vital Signs ED Triage Vitals  Encounter Vitals Group     BP 01/16/24 1448  120/84     Systolic BP Percentile --      Diastolic BP Percentile --      Pulse Rate 01/16/24 1448 83     Resp 01/16/24 1448 16     Temp 01/16/24 1448 98.1 F (36.7 C)     Temp Source 01/16/24 1448 Oral     SpO2 01/16/24 1448 99 %     Weight --      Height --      Head Circumference --      Peak Flow --      Pain Score 01/16/24 1445 6     Pain Loc --      Pain Education --      Exclude from Growth Chart --    No data found.  Updated Vital Signs BP 120/84 (BP Location: Right Arm)   Pulse 83   Temp 98.1 F (36.7 C) (Oral)   Resp 16   SpO2 99%   Visual Acuity Right Eye Distance:   Left Eye Distance:   Bilateral Distance:    Right Eye Near:   Left Eye Near:    Bilateral Near:     Physical Exam Constitutional:      General: She is not in acute  distress.    Appearance: She is well-developed and normal weight. She is not ill-appearing.  Cardiovascular:     Rate and Rhythm: Normal rate and regular rhythm.  Pulmonary:     Effort: Pulmonary effort is normal.     Breath sounds: Normal breath sounds.  Abdominal:     Palpations: Abdomen is soft.     Tenderness: There is abdominal tenderness in the epigastric area. There is no guarding or rebound.  Skin:    General: Skin is warm.  Neurological:     Mental Status: She is alert.      UC Treatments / Results  Labs (all labs ordered are listed, but only abnormal results are displayed) Labs Reviewed - No data to display  EKG   Radiology No results found.  Procedures Procedures (including critical care time)  Medications Ordered in UC Medications  alum & mag hydroxide-simeth (MAALOX/MYLANTA) 200-200-20 MG/5ML suspension 30 mL (30 mLs Oral Given 01/16/24 1508)    Initial Impression / Assessment and Plan / UC Course  I have reviewed the triage vital signs and the nursing notes.  Pertinent labs & imaging results that were available during my care of the patient were reviewed by me and considered in my medical  decision making (see chart for details).   Final Clinical Impressions(s) / UC Diagnoses   Final diagnoses:  Abdominal pain, epigastric     Discharge Instructions      You were seen today for abdominal pain.  I this this is likely related to increased acid/irritation.  I have given you an oral medication while here today.  I have sent out a medication to help for this.  You may take this daily x 2 weeks.  Avoid alcohol, NSAIDS, and spicy foods.  Please return if you are not improving.   ED Prescriptions     Medication Sig Dispense Auth. Provider   omeprazole (PRILOSEC) 20 MG capsule Take 1 capsule (20 mg total) by mouth daily. 14 capsule Lesle Ras, MD      PDMP not reviewed this encounter.   Lesle Ras, MD 01/16/24 1521

## 2024-01-16 NOTE — ED Triage Notes (Signed)
 Mild nausea.  Pain in the middle of abdomen.  Has not eaten today.  No vomiting today.  Patient reports vomiting Friday multiple times.  No vomiting today.  Has not had any diarrhea.    Patient is not taking any medications for symptoms.  Patient is concerned for an ulcer

## 2024-02-16 DIAGNOSIS — Z419 Encounter for procedure for purposes other than remedying health state, unspecified: Secondary | ICD-10-CM | POA: Diagnosis not present

## 2024-03-17 DIAGNOSIS — Z419 Encounter for procedure for purposes other than remedying health state, unspecified: Secondary | ICD-10-CM | POA: Diagnosis not present

## 2024-04-06 DIAGNOSIS — F649 Gender identity disorder, unspecified: Secondary | ICD-10-CM | POA: Diagnosis not present

## 2024-04-17 DIAGNOSIS — Z419 Encounter for procedure for purposes other than remedying health state, unspecified: Secondary | ICD-10-CM | POA: Diagnosis not present

## 2024-04-26 DIAGNOSIS — F411 Generalized anxiety disorder: Secondary | ICD-10-CM | POA: Diagnosis not present

## 2024-05-18 DIAGNOSIS — Z419 Encounter for procedure for purposes other than remedying health state, unspecified: Secondary | ICD-10-CM | POA: Diagnosis not present

## 2024-05-24 DIAGNOSIS — F411 Generalized anxiety disorder: Secondary | ICD-10-CM | POA: Diagnosis not present

## 2024-06-07 DIAGNOSIS — F411 Generalized anxiety disorder: Secondary | ICD-10-CM | POA: Diagnosis not present
